# Patient Record
Sex: Male | Born: 1960 | Race: White | Hispanic: No | Marital: Single | Smoking: Current every day smoker
Health system: Southern US, Community
[De-identification: ages and names within clinical notes are randomized; demographics above are authoritative.]

---

## 2014-06-07 ENCOUNTER — Encounter: Payer: Self-pay | Admitting: Hematology & Oncology

## 2014-06-17 ENCOUNTER — Other Ambulatory Visit: Payer: Self-pay | Admitting: Family

## 2014-06-17 ENCOUNTER — Telehealth: Payer: Self-pay | Admitting: Hematology & Oncology

## 2014-06-17 DIAGNOSIS — D696 Thrombocytopenia, unspecified: Secondary | ICD-10-CM

## 2014-06-17 NOTE — Telephone Encounter (Signed)
I spoke w NEW PATIENT today to remind them of their appointment with Dr. Ennever. Also, advised them to bring all medication bottles and insurance card information. ° °

## 2014-06-18 ENCOUNTER — Other Ambulatory Visit (HOSPITAL_BASED_OUTPATIENT_CLINIC_OR_DEPARTMENT_OTHER): Payer: Managed Care, Other (non HMO) | Admitting: Lab

## 2014-06-18 ENCOUNTER — Encounter: Payer: Self-pay | Admitting: Family

## 2014-06-18 ENCOUNTER — Ambulatory Visit (HOSPITAL_BASED_OUTPATIENT_CLINIC_OR_DEPARTMENT_OTHER): Payer: Managed Care, Other (non HMO) | Admitting: Family

## 2014-06-18 ENCOUNTER — Ambulatory Visit (HOSPITAL_BASED_OUTPATIENT_CLINIC_OR_DEPARTMENT_OTHER)
Admission: RE | Admit: 2014-06-18 | Discharge: 2014-06-18 | Disposition: A | Discharge status: Home / Self Care | Payer: Managed Care, Other (non HMO) | Source: Ambulatory Visit | Attending: Family | Admitting: Family

## 2014-06-18 ENCOUNTER — Ambulatory Visit: Payer: Managed Care, Other (non HMO)

## 2014-06-18 VITALS — BP 126/65 | HR 93 | Temp 97.9°F | Resp 18 | Ht 67.0 in | Wt 190.0 lb

## 2014-06-18 DIAGNOSIS — Z720 Tobacco use: Secondary | ICD-10-CM

## 2014-06-18 DIAGNOSIS — D696 Thrombocytopenia, unspecified: Secondary | ICD-10-CM

## 2014-06-18 DIAGNOSIS — F17200 Nicotine dependence, unspecified, uncomplicated: Secondary | ICD-10-CM | POA: Insufficient documentation

## 2014-06-18 DIAGNOSIS — R079 Chest pain, unspecified: Secondary | ICD-10-CM | POA: Diagnosis present

## 2014-06-18 DIAGNOSIS — J9811 Atelectasis: Secondary | ICD-10-CM | POA: Insufficient documentation

## 2014-06-18 DIAGNOSIS — R232 Flushing: Secondary | ICD-10-CM | POA: Diagnosis not present

## 2014-06-18 DIAGNOSIS — R0789 Other chest pain: Secondary | ICD-10-CM | POA: Insufficient documentation

## 2014-06-18 DIAGNOSIS — D751 Secondary polycythemia: Secondary | ICD-10-CM

## 2014-06-18 DIAGNOSIS — D582 Other hemoglobinopathies: Secondary | ICD-10-CM

## 2014-06-18 LAB — CBC WITH DIFFERENTIAL (CANCER CENTER ONLY)
BASO#: 0.1 10*3/uL (ref 0.0–0.2)
BASO%: 0.5 % (ref 0.0–2.0)
EOS%: 2.8 % (ref 0.0–7.0)
Eosinophils Absolute: 0.3 10*3/uL (ref 0.0–0.5)
HCT: 51.1 % — ABNORMAL HIGH (ref 38.7–49.9)
HGB: 17.6 g/dL — ABNORMAL HIGH (ref 13.0–17.1)
LYMPH#: 2.7 10*3/uL (ref 0.9–3.3)
LYMPH%: 24.5 % (ref 14.0–48.0)
MCH: 32.3 pg (ref 28.0–33.4)
MCHC: 34.4 g/dL (ref 32.0–35.9)
MCV: 94 fL (ref 82–98)
MONO#: 0.9 10*3/uL (ref 0.1–0.9)
MONO%: 7.8 % (ref 0.0–13.0)
NEUT%: 64.4 % (ref 40.0–80.0)
NEUTROS ABS: 7.1 10*3/uL — AB (ref 1.5–6.5)
PLATELETS: 168 10*3/uL (ref 145–400)
RBC: 5.45 10*6/uL (ref 4.20–5.70)
RDW: 14.1 % (ref 11.1–15.7)
WBC: 11 10*3/uL — ABNORMAL HIGH (ref 4.0–10.0)

## 2014-06-18 LAB — CHCC SATELLITE - SMEAR

## 2014-06-18 LAB — TECHNOLOGIST REVIEW CHCC SATELLITE

## 2014-06-18 NOTE — Patient Instructions (Signed)
Smoking Cessation Quitting smoking is important to your health and has many advantages. However, it is not always easy to quit since nicotine is a very addictive drug. Oftentimes, people try 3 times or more before being able to quit. This document explains the best ways for you to prepare to quit smoking. Quitting takes hard work and a lot of effort, but you can do it. ADVANTAGES OF QUITTING SMOKING  You will live longer, feel better, and live better.  Your body will feel the impact of quitting smoking almost immediately.  Within 20 minutes, blood pressure decreases. Your pulse returns to its normal level.  After 8 hours, carbon monoxide levels in the blood return to normal. Your oxygen level increases.  After 24 hours, the chance of having a heart attack starts to decrease. Your breath, hair, and body stop smelling like smoke.  After 48 hours, damaged nerve endings begin to recover. Your sense of taste and smell improve.  After 72 hours, the body is virtually free of nicotine. Your bronchial tubes relax and breathing becomes easier.  After 2 to 12 weeks, lungs can hold more air. Exercise becomes easier and circulation improves.  The risk of having a heart attack, stroke, cancer, or lung disease is greatly reduced.  After 1 year, the risk of coronary heart disease is cut in half.  After 5 years, the risk of stroke falls to the same as a nonsmoker.  After 10 years, the risk of lung cancer is cut in half and the risk of other cancers decreases significantly.  After 15 years, the risk of coronary heart disease drops, usually to the level of a nonsmoker.  If you are pregnant, quitting smoking will improve your chances of having a healthy baby.  The people you live with, especially any children, will be healthier.  You will have extra money to spend on things other than cigarettes. QUESTIONS TO THINK ABOUT BEFORE ATTEMPTING TO QUIT You may want to talk about your answers with your  health care provider.  Why do you want to quit?  If you tried to quit in the past, what helped and what did not?  What will be the most difficult situations for you after you quit? How will you plan to handle them?  Who can help you through the tough times? Your family? Friends? A health care provider?  What pleasures do you get from smoking? What ways can you still get pleasure if you quit? Here are some questions to ask your health care provider:  How can you help me to be successful at quitting?  What medicine do you think would be best for me and how should I take it?  What should I do if I need more help?  What is smoking withdrawal like? How can I get information on withdrawal? GET READY  Set a quit date.  Change your environment by getting rid of all cigarettes, ashtrays, matches, and lighters in your home, car, or work. Do not let people smoke in your home.  Review your past attempts to quit. Think about what worked and what did not. GET SUPPORT AND ENCOURAGEMENT You have a better chance of being successful if you have help. You can get support in many ways.  Tell your family, friends, and coworkers that you are going to quit and need their support. Ask them not to smoke around you.  Get individual, group, or telephone counseling and support. Programs are available at local hospitals and health centers. Call   your local health department for information about programs in your area.  Spiritual beliefs and practices may help some smokers quit.  Download a "quit meter" on your computer to keep track of quit statistics, such as how long you have gone without smoking, cigarettes not smoked, and money saved.  Get a self-help book about quitting smoking and staying off tobacco. LEARN NEW SKILLS AND BEHAVIORS  Distract yourself from urges to smoke. Talk to someone, go for a walk, or occupy your time with a task.  Change your normal routine. Take a different route to work.  Drink tea instead of coffee. Eat breakfast in a different place.  Reduce your stress. Take a hot bath, exercise, or read a book.  Plan something enjoyable to do every day. Reward yourself for not smoking.  Explore interactive web-based programs that specialize in helping you quit. GET MEDICINE AND USE IT CORRECTLY Medicines can help you stop smoking and decrease the urge to smoke. Combining medicine with the above behavioral methods and support can greatly increase your chances of successfully quitting smoking.  Nicotine replacement therapy helps deliver nicotine to your body without the negative effects and risks of smoking. Nicotine replacement therapy includes nicotine gum, lozenges, inhalers, nasal sprays, and skin patches. Some may be available over-the-counter and others require a prescription.  Antidepressant medicine helps people abstain from smoking, but how this works is unknown. This medicine is available by prescription.  Nicotinic receptor partial agonist medicine simulates the effect of nicotine in your brain. This medicine is available by prescription. Ask your health care provider for advice about which medicines to use and how to use them based on your health history. Your health care provider will tell you what side effects to look out for if you choose to be on a medicine or therapy. Carefully read the information on the package. Do not use any other product containing nicotine while using a nicotine replacement product.  RELAPSE OR DIFFICULT SITUATIONS Most relapses occur within the first 3 months after quitting. Do not be discouraged if you start smoking again. Remember, most people try several times before finally quitting. You may have symptoms of withdrawal because your body is used to nicotine. You may crave cigarettes, be irritable, feel very hungry, cough often, get headaches, or have difficulty concentrating. The withdrawal symptoms are only temporary. They are strongest  when you first quit, but they will go away within 10-14 days. To reduce the chances of relapse, try to:  Avoid drinking alcohol. Drinking lowers your chances of successfully quitting.  Reduce the amount of caffeine you consume. Once you quit smoking, the amount of caffeine in your body increases and can give you symptoms, such as a rapid heartbeat, sweating, and anxiety.  Avoid smokers because they can make you want to smoke.  Do not let weight gain distract you. Many smokers will gain weight when they quit, usually less than 10 pounds. Eat a healthy diet and stay active. You can always lose the weight gained after you quit.  Find ways to improve your mood other than smoking. FOR MORE INFORMATION  www.smokefree.gov  Document Released: 03/30/2001 Document Revised: 08/20/2013 Document Reviewed: 07/15/2011 [employer] Patient Information 2015 [employer]. This information is not intended to replace advice given to you by your health care provider. Make sure you discuss any questions you have with your health care provider.  

## 2014-06-18 NOTE — Progress Notes (Signed)
Hematology/Oncology Consultation   Name: Dale Tran      MRN: [id number]    Location: Room/bed info not found  Date: 06/18/2014 Time:8:49 AM   REFERRING PHYSICIAN: Synetta Failaniel Jobe, MD  REASON FOR CONSULT: Thrombocytopenia   DIAGNOSIS: Polycythemia vera   HISTORY OF PRESENT ILLNESS: Dale Tran is a very pleasant 54 yo white male with a history of elevated WBC (10.6) and Hgb (17.7) and low platelets (156). He has had no problems with infections.  He has a ruddy complexion. He says he has been this way for several years.  He is a smoker, 1 ppd.  He has saw Dr. Ottis StainHuff with hematology 6 years ago for these same issues.  Has an abdominal aortic aneurysm that is being monitored with ultrasound periodically.  He also has a mass on his left kidney that was determined to be benign.It is still being monitored via ultrasound as well.  His grandfather passed away from kidney cancer.  He has a history of skin cancer on his back that was treated and has not returned. He sees his dermatologist once a year.  He takes an aspirin daily.  He denies fever, chills, n/v, cough, rash ,dizziness, headaches, SOB, chest pain, palpitations, abdominal pain, constipation, diarrhea, blood in urine or stool.  No swelling or tenderness in his extremities. He does have numbness and tingling in his hands and feet at times. This comes and goes. No new aches or pains.  His appetite is good and he is drinking plenty of fluids. His weight is stable at 190 lbs.  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  He is originally from [place] and has lived in this area for the last 25 years.   ROS: All other 10 point review of systems is negative.   PAST MEDICAL HISTORY:   No past medical history on file.  ALLERGIES: No Known Allergies    MEDICATIONS:  No current outpatient prescriptions on file prior to visit.   No current facility-administered medications on file prior to visit.     PAST SURGICAL HISTORY No past  surgical history on file.  FAMILY HISTORY: No family history on file.  SOCIAL HISTORY:  reports that he has been smoking Cigarettes.  He started smoking about 39 years ago. He has a 40 pack-year smoking history. He has never used smokeless tobacco. His alcohol and drug histories are not on file.  PERFORMANCE STATUS: The patient's performance status is 1 - Symptomatic but completely ambulatory  PHYSICAL EXAM: Most Recent Vital Signs: Blood pressure 126/65, pulse 93, temperature 97.9 F (36.6 C), temperature source Oral, resp. rate 18, height 5\' 7"  (1.702 m), weight 190 lb (86.183 kg). BP 126/65 mmHg  Pulse 93  Temp(Src) 97.9 F (36.6 C) (Oral)  Resp 18  Ht 5\' 7"  (1.702 m)  Wt 190 lb (86.183 kg)  BMI 29.75 kg/m2  General Appearance:    Alert, cooperative, no distress, appears stated age  Head:    Normocephalic, without obvious abnormality, atraumatic  Eyes:    PERRL, conjunctiva/corneas clear, EOM's intact, fundi    benign, both eyes             Throat:   Lips, mucosa, and tongue normal; teeth and gums normal  Neck:   Supple, symmetrical, trachea midline, no adenopathy;       thyroid:  No enlargement/tenderness/nodules; no carotid   bruit or JVD  Back:     Symmetric, no curvature, ROM normal, no CVA tenderness  Lungs:  Clear to auscultation bilaterally, respirations unlabored  Chest wall:    No tenderness or deformity  Heart:    Regular rate and rhythm, S1 and S2 normal, no murmur, rub   or gallop  Abdomen:     Soft, non-tender, bowel sounds active all four quadrants,    no masses, no organomegaly        Extremities:   Extremities normal, atraumatic, no cyanosis or edema  Pulses:   2+ and symmetric all extremities  Skin:   Skin color, texture, turgor normal, no rashes or lesions  Lymph nodes:   Cervical, supraclavicular, and axillary nodes normal  Neurologic:   CNII-XII intact. Normal strength, sensation and reflexes      throughout   LABORATORY DATA:  Results for  orders placed or performed in visit on 06/18/14 (from the past 48 hour(s))  CBC with [facility])     Status: Abnormal   Collection Time: 06/18/14  8:30 AM  Result Value Ref Range   WBC 11.0 (H) 4.0 - 10.0 10e3/uL   RBC 5.45 4.20 - 5.70 10e6/uL   HGB 17.6 (H) 13.0 - 17.1 g/dL   HCT 96.2 (H) 95.2 - 84.1 %   MCV 94 82 - 98 fL   MCH 32.3 28.0 - 33.4 pg   MCHC 34.4 32.0 - 35.9 g/dL   RDW 32.4 40.1 - 02.7 %   Platelets 168 145 - 400 10e3/uL   NEUT# 7.1 (H) 1.5 - 6.5 10e3/uL   LYMPH# 2.7 0.9 - 3.3 10e3/uL   MONO# 0.9 0.1 - 0.9 10e3/uL   Eosinophils Absolute 0.3 0.0 - 0.5 10e3/uL   BASO# 0.1 0.0 - 0.2 10e3/uL   NEUT% 64.4 40.0 - 80.0 %   LYMPH% 24.5 14.0 - 48.0 %   MONO% 7.8 0.0 - 13.0 %   EOS% 2.8 0.0 - 7.0 %   BASO% 0.5 0.0 - 2.0 %  [facility] - Smear     Status: None   Collection Time: 06/18/14  8:30 AM  Result Value Ref Range   Smear Result Smear Available   TECHNOLOGIST REVIEW [facility]     Status: None   Collection Time: 06/18/14  8:30 AM  Result Value Ref Range   Tech Review Platelet count consistent in citrate       RADIOGRAPHY: No results found.     PATHOLOGY: None  ASSESSMENT/PLAN: [name] is a very pleasant 54 yo white male with a history of elevated WBC (10.6) and Hgb (17.7) and low platelets (156). His WBC today are 11, Hgb 17.6 and platelets 168. We will see what the rest of his lab work shows.  He is a smoker and has not had a chest x-ray in 6 years. We will get one on him today.  We will see what his tests show and then determine when to get him back in. It is likely that he will need phlebotomized.  All questions were answered. He knows to call here with any other questions or concerns and to got to the ED in the event of an emergency. We can certainly see him sooner if need be.   The patient was discussed with and also seen by Dr. Myna Hidalgo and he is in agreement with the aforementioned.   [facility]    Addendum: I saw and  examined the patient with Naylene Foell. We get his blood smear. I really cannot see anything on his blood smear that looked suspicious.  When I saw him, I was  just struck has to how red his face was. He definitely had some facial plethora. It looked like he was somebody who had polycythemia.  His hemoglobin is on the higher side.  I know that he smokes. Is possible that he may have spurious polycythemia. He does not have hypertension.  I think that we probably will need to check a JAK2 assay on him. I do want to check an erythropoietin level on him. I want to check iron studies.  It would not surprise me if we had to phlebotomize him. Again, he looks like somebody who has "too much blood".  We will plan to get him back once we get these other labs back.  He is a nice man. I had a good time talking to him. We spent about 45 minutes with him.  Cindee Lame

## 2014-06-20 LAB — FERRITIN: Ferritin: 204 ng/mL (ref 22–322)

## 2014-06-20 LAB — IRON AND TIBC
%SAT: 22 % (ref 20–55)
Iron: 79 ug/dL (ref 42–165)
TIBC: 359 ug/dL (ref 215–435)
UIBC: 280 ug/dL (ref 125–400)

## 2014-06-20 LAB — ERYTHROPOIETIN: Erythropoietin: 19.7 m[IU]/mL — ABNORMAL HIGH (ref 2.6–18.5)

## 2014-06-27 ENCOUNTER — Encounter: Payer: Self-pay | Admitting: Hematology & Oncology

## 2014-06-28 ENCOUNTER — Encounter: Payer: Self-pay | Admitting: Hematology & Oncology

## 2014-07-10 ENCOUNTER — Other Ambulatory Visit: Payer: Self-pay | Admitting: Family

## 2014-07-10 DIAGNOSIS — D582 Other hemoglobinopathies: Secondary | ICD-10-CM

## 2014-07-11 ENCOUNTER — Telehealth: Payer: Self-pay | Admitting: Hematology & Oncology

## 2014-07-11 NOTE — Telephone Encounter (Signed)
Mailed may schedule °

## 2014-09-02 ENCOUNTER — Telehealth: Payer: Self-pay | Admitting: Hematology & Oncology

## 2014-09-02 NOTE — Telephone Encounter (Signed)
Patient called and cx 09/04/14 apt and did not want to resch

## 2014-09-04 ENCOUNTER — Ambulatory Visit: Payer: Managed Care, Other (non HMO) | Admitting: Hematology & Oncology

## 2014-09-04 ENCOUNTER — Other Ambulatory Visit: Payer: Managed Care, Other (non HMO)

## 2015-06-15 IMAGING — DX DG CHEST 2V
2 series · 2 of 2 positions shown · non-contrast
Comparison: None.

CLINICAL DATA: Left-sided chest pain.

EXAM:
CHEST  2 VIEW

[chest pa]
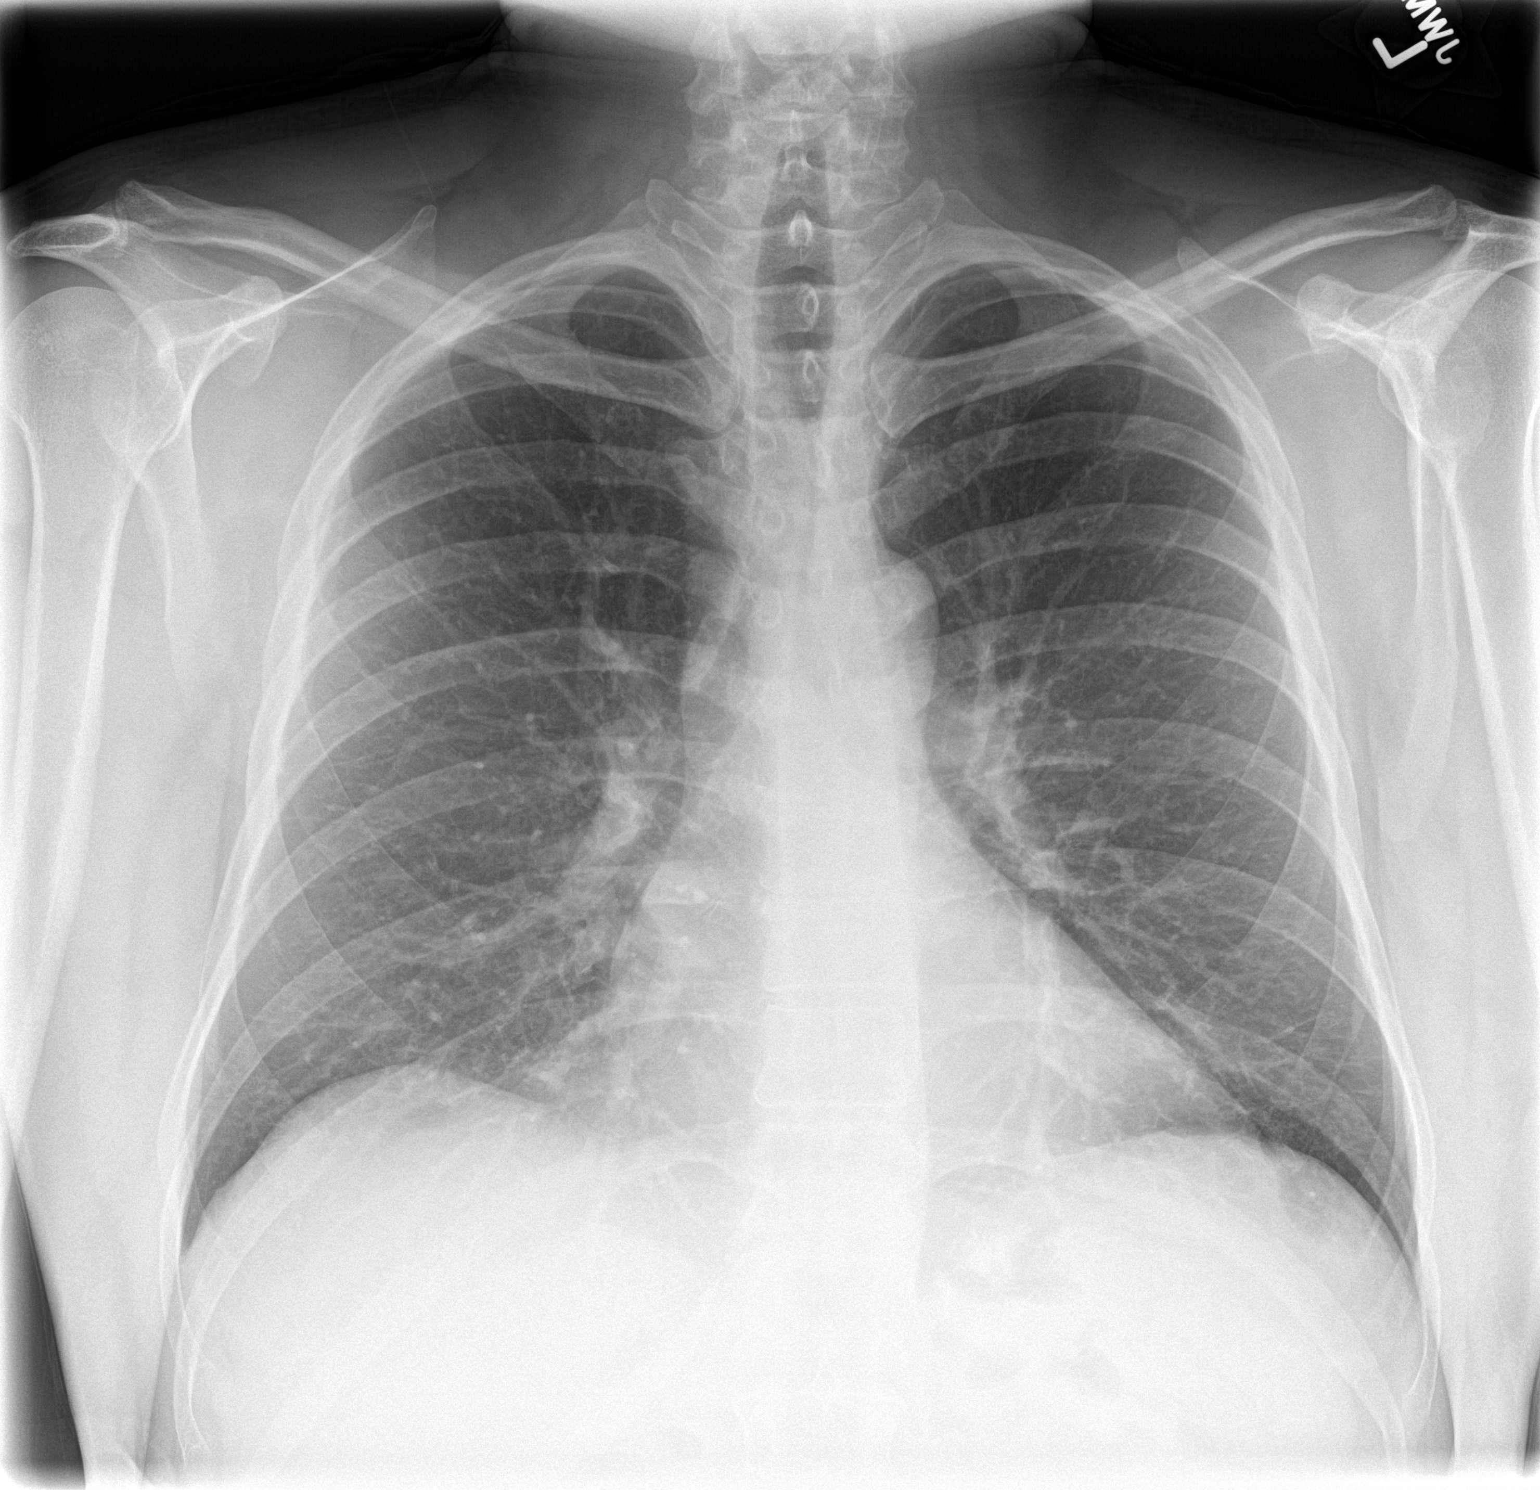

[chest lat]
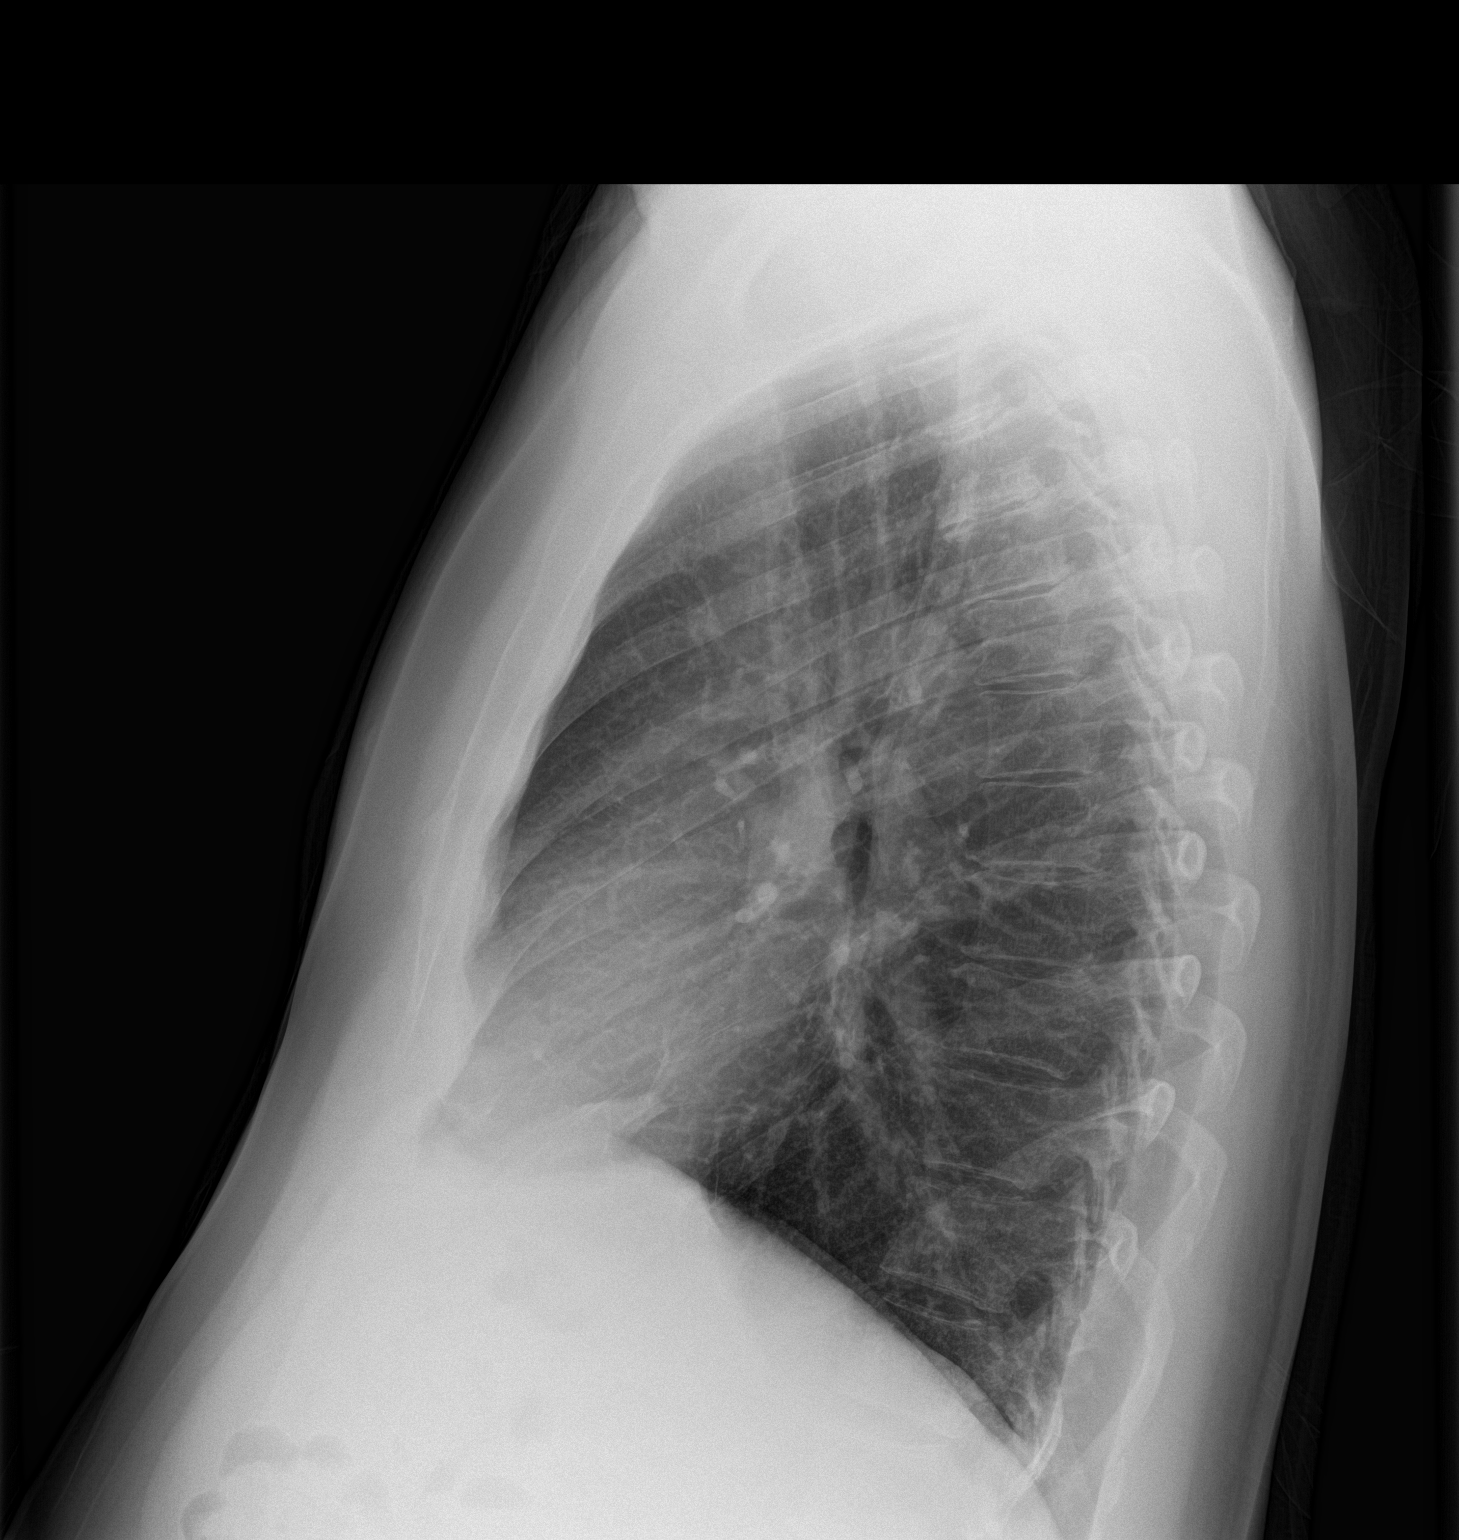

[2 of 2 positions shown; findings below may reference images not displayed]

FINDINGS: Mediastinum and hilar structures are normal. Mild right base
atelectasis. Lungs are otherwise clear. Heart size normal. No
pleural effusion or pneumothorax.
IMPRESSION: Mild right base atelectasis. No acute cardiopulmonary disease
otherwise noted.
